# Patient Record
Sex: Male | Born: 1980 | Race: White | Hispanic: No | Marital: Married | Smoking: Current every day smoker
Health system: Southern US, Community
[De-identification: ages and names within clinical notes are randomized; demographics above are authoritative.]

## PROBLEM LIST (undated history)

## (undated) HISTORY — PX: LYMPHADENECTOMY: SHX15

---

## 2017-12-14 ENCOUNTER — Emergency Department (HOSPITAL_BASED_OUTPATIENT_CLINIC_OR_DEPARTMENT_OTHER)
Admission: EM | Admit: 2017-12-14 | Discharge: 2017-12-14 | Disposition: A | Discharge status: Home / Self Care | Payer: Worker's Compensation | Attending: Emergency Medicine | Admitting: Emergency Medicine

## 2017-12-14 ENCOUNTER — Other Ambulatory Visit: Payer: Self-pay

## 2017-12-14 ENCOUNTER — Encounter (HOSPITAL_BASED_OUTPATIENT_CLINIC_OR_DEPARTMENT_OTHER): Payer: Self-pay | Admitting: *Deleted

## 2017-12-14 ENCOUNTER — Emergency Department (HOSPITAL_BASED_OUTPATIENT_CLINIC_OR_DEPARTMENT_OTHER): Payer: Worker's Compensation

## 2017-12-14 DIAGNOSIS — Y92410 Unspecified street and highway as the place of occurrence of the external cause: Secondary | ICD-10-CM | POA: Insufficient documentation

## 2017-12-14 DIAGNOSIS — F17200 Nicotine dependence, unspecified, uncomplicated: Secondary | ICD-10-CM | POA: Insufficient documentation

## 2017-12-14 DIAGNOSIS — M542 Cervicalgia: Secondary | ICD-10-CM | POA: Diagnosis not present

## 2017-12-14 DIAGNOSIS — S39012A Strain of muscle, fascia and tendon of lower back, initial encounter: Secondary | ICD-10-CM

## 2017-12-14 DIAGNOSIS — Z79899 Other long term (current) drug therapy: Secondary | ICD-10-CM | POA: Insufficient documentation

## 2017-12-14 DIAGNOSIS — Y9389 Activity, other specified: Secondary | ICD-10-CM | POA: Insufficient documentation

## 2017-12-14 DIAGNOSIS — S3992XA Unspecified injury of lower back, initial encounter: Secondary | ICD-10-CM | POA: Diagnosis present

## 2017-12-14 DIAGNOSIS — Y990 Civilian activity done for income or pay: Secondary | ICD-10-CM | POA: Insufficient documentation

## 2017-12-14 MED ORDER — CYCLOBENZAPRINE HCL 10 MG PO TABS: 10.0000 mg | ORAL_TABLET | Freq: Two times a day (BID) | ORAL | 0 refills | Status: AC | PRN

## 2017-12-14 MED ORDER — NAPROXEN 500 MG PO TABS: 500.0000 mg | ORAL_TABLET | Freq: Two times a day (BID) | ORAL | 0 refills | Status: AC
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
## 2017-12-14 NOTE — Discharge Instructions (Signed)
Please read attached information regarding your condition. Take naproxen and Flexeril for the next 2-3 days scheduled to help with your symptoms.  Apply heating pad and stretch areas as tolerated. Massaging may also help. Return to ED for worsening symptoms, severe back pain, numbness in legs, inability to walk, loss of bladder function.

## 2017-12-14 NOTE — ED Notes (Signed)
Patient states he has already had the drug screen done before he came here.
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
